# Patient Record
Sex: Female | Born: 1985 | Race: White | Marital: Married
Health system: Southern US, Community
[De-identification: ages and names within clinical notes are randomized; demographics above are authoritative.]

---

## 2013-09-30 ENCOUNTER — Ambulatory Visit
Admission: RE | Admit: 2013-09-30 | Discharge: 2013-09-30 | Disposition: A | Discharge status: Home / Self Care | Payer: Private Health Insurance - Indemnity | Source: Ambulatory Visit | Attending: Family Medicine | Admitting: Family Medicine

## 2013-09-30 ENCOUNTER — Other Ambulatory Visit: Payer: Self-pay | Admitting: Family Medicine

## 2013-09-30 DIAGNOSIS — K5900 Constipation, unspecified: Secondary | ICD-10-CM

## 2013-09-30 DIAGNOSIS — R109 Unspecified abdominal pain: Secondary | ICD-10-CM

## 2013-12-20 ENCOUNTER — Other Ambulatory Visit: Payer: Self-pay | Admitting: Family Medicine

## 2013-12-20 DIAGNOSIS — N6320 Unspecified lump in the left breast, unspecified quadrant: Secondary | ICD-10-CM

## 2013-12-24 ENCOUNTER — Ambulatory Visit
Admission: RE | Admit: 2013-12-24 | Discharge: 2013-12-24 | Disposition: A | Discharge status: Home / Self Care | Payer: Private Health Insurance - Indemnity | Source: Ambulatory Visit | Attending: Family Medicine | Admitting: Family Medicine

## 2013-12-24 ENCOUNTER — Encounter (INDEPENDENT_AMBULATORY_CARE_PROVIDER_SITE_OTHER): Payer: Self-pay

## 2013-12-24 DIAGNOSIS — N6320 Unspecified lump in the left breast, unspecified quadrant: Secondary | ICD-10-CM

## 2014-12-09 IMAGING — CR DG ABDOMEN 1V
1 series · 1 of 1 positions shown · non-contrast
Comparison: None.

CLINICAL DATA: Constipation.  Bilateral lower abdominal pain.

EXAM:
ABDOMEN - 1 VIEW

[view not recorded]
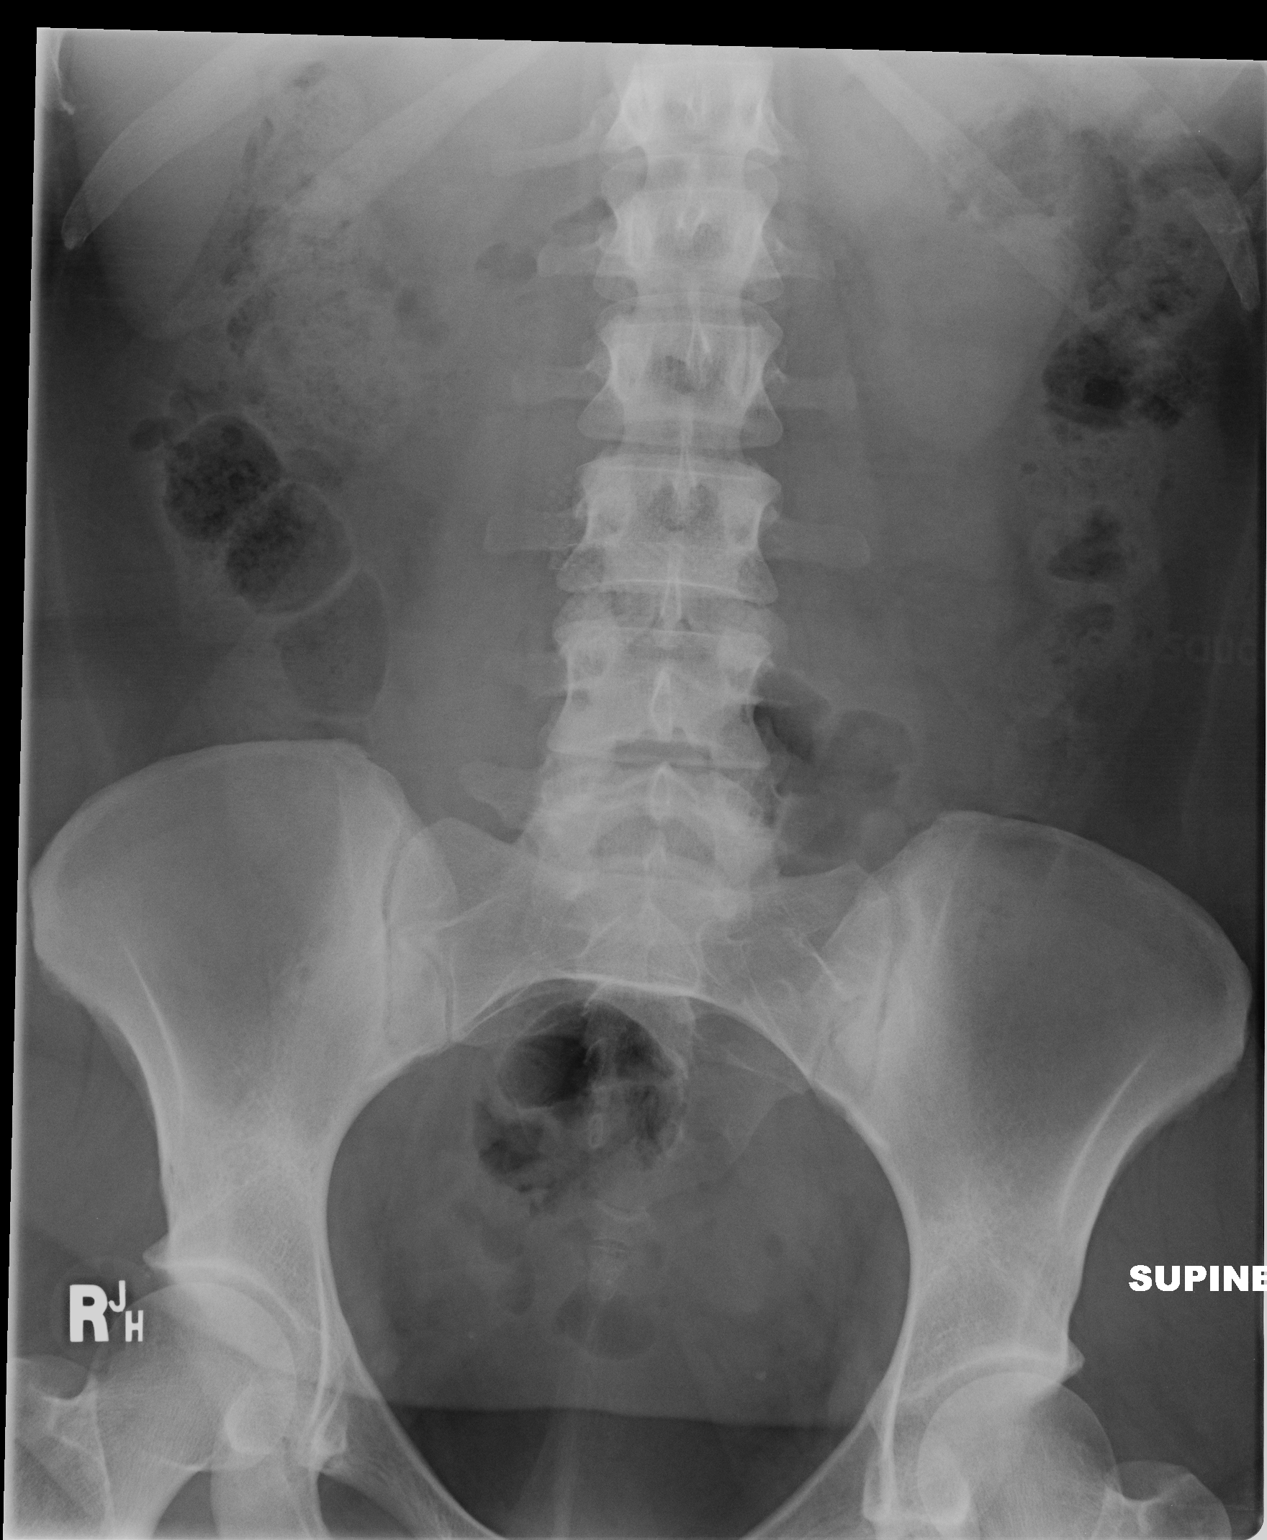

[1 of 1 positions shown; findings below may reference images not displayed]

FINDINGS: No renal calculi. The bowel gas pattern is normal. Stool burden
within normal limits. No dilated loops of large or small bowel. The
bones appear within normal limits.
IMPRESSION: Normal bowel gas pattern.  No renal calculi.
